# Patient Record
Sex: Male | Born: 1960 | Race: White | Hispanic: No | Marital: Married | Smoking: Never smoker
Health system: Southern US, Community
[De-identification: ages and names within clinical notes are randomized; demographics above are authoritative.]

---

## 2004-03-27 ENCOUNTER — Ambulatory Visit: Payer: Self-pay | Admitting: Internal Medicine

## 2004-05-12 ENCOUNTER — Ambulatory Visit: Payer: Self-pay | Admitting: Internal Medicine

## 2004-06-09 ENCOUNTER — Ambulatory Visit: Payer: Self-pay | Admitting: Internal Medicine

## 2004-06-27 ENCOUNTER — Ambulatory Visit: Payer: Self-pay | Admitting: Internal Medicine

## 2004-09-20 ENCOUNTER — Ambulatory Visit: Payer: Self-pay | Admitting: Internal Medicine

## 2004-09-25 ENCOUNTER — Ambulatory Visit: Payer: Self-pay | Admitting: Internal Medicine

## 2004-10-25 ENCOUNTER — Ambulatory Visit: Payer: Self-pay | Admitting: Internal Medicine

## 2005-01-31 ENCOUNTER — Ambulatory Visit: Payer: Self-pay | Admitting: Internal Medicine

## 2005-02-11 ENCOUNTER — Emergency Department: Payer: Self-pay | Admitting: Emergency Medicine

## 2005-02-25 ENCOUNTER — Ambulatory Visit: Payer: Self-pay | Admitting: Internal Medicine

## 2005-04-22 ENCOUNTER — Ambulatory Visit: Payer: Self-pay | Admitting: Internal Medicine

## 2005-06-03 ENCOUNTER — Ambulatory Visit: Payer: Self-pay | Admitting: Internal Medicine

## 2005-09-09 ENCOUNTER — Ambulatory Visit: Payer: Self-pay | Admitting: Internal Medicine

## 2005-09-25 ENCOUNTER — Ambulatory Visit: Payer: Self-pay | Admitting: Internal Medicine

## 2005-11-30 ENCOUNTER — Ambulatory Visit: Payer: Self-pay | Admitting: Internal Medicine

## 2005-12-25 ENCOUNTER — Ambulatory Visit: Payer: Self-pay | Admitting: Internal Medicine

## 2006-03-15 ENCOUNTER — Ambulatory Visit: Payer: Self-pay | Admitting: Internal Medicine

## 2006-03-27 ENCOUNTER — Ambulatory Visit: Payer: Self-pay | Admitting: Internal Medicine

## 2006-05-15 ENCOUNTER — Ambulatory Visit: Payer: Self-pay | Admitting: Internal Medicine

## 2006-05-16 ENCOUNTER — Ambulatory Visit: Payer: Self-pay | Admitting: Internal Medicine

## 2006-05-27 ENCOUNTER — Ambulatory Visit: Payer: Self-pay | Admitting: Internal Medicine

## 2006-06-11 ENCOUNTER — Emergency Department: Payer: Self-pay | Admitting: Emergency Medicine

## 2006-11-26 ENCOUNTER — Ambulatory Visit: Payer: Self-pay | Admitting: Internal Medicine

## 2006-11-28 ENCOUNTER — Ambulatory Visit: Payer: Self-pay | Admitting: Internal Medicine

## 2006-12-26 ENCOUNTER — Ambulatory Visit: Payer: Self-pay | Admitting: Internal Medicine

## 2007-05-14 IMAGING — CR DG CHEST 2V
1 series · 4 of 4 positions shown · non-contrast
Comparison: none

REASON FOR EXAM: Xray chest seminoma, lung mass
COMMENTS:

[Series 1: view not recorded · 0.17mm/px · 4 of 4 slices shown]
[im 1/4]
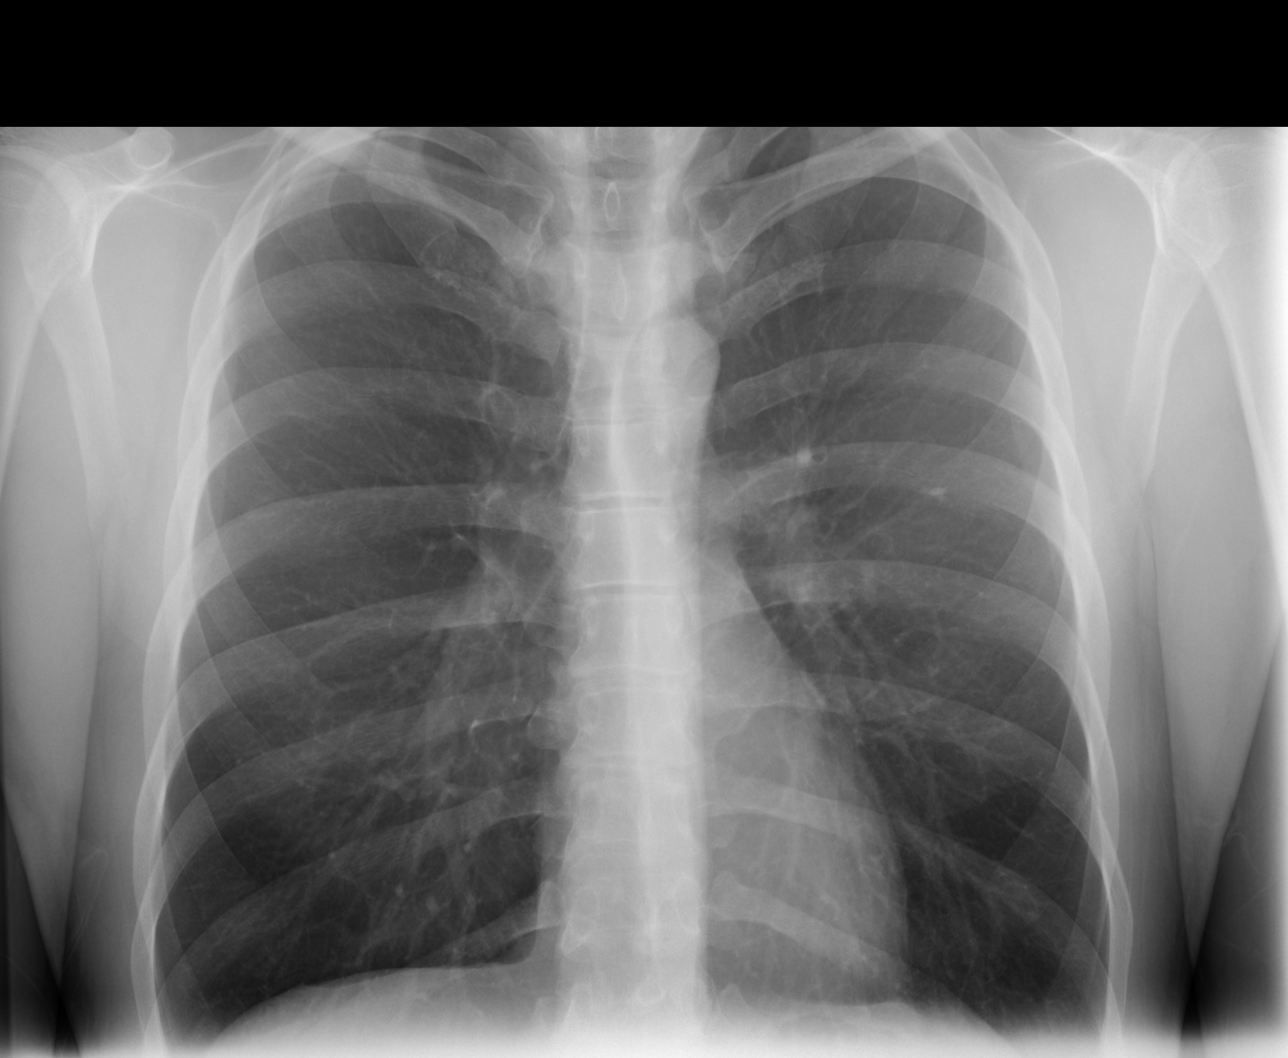
[im 2/4]
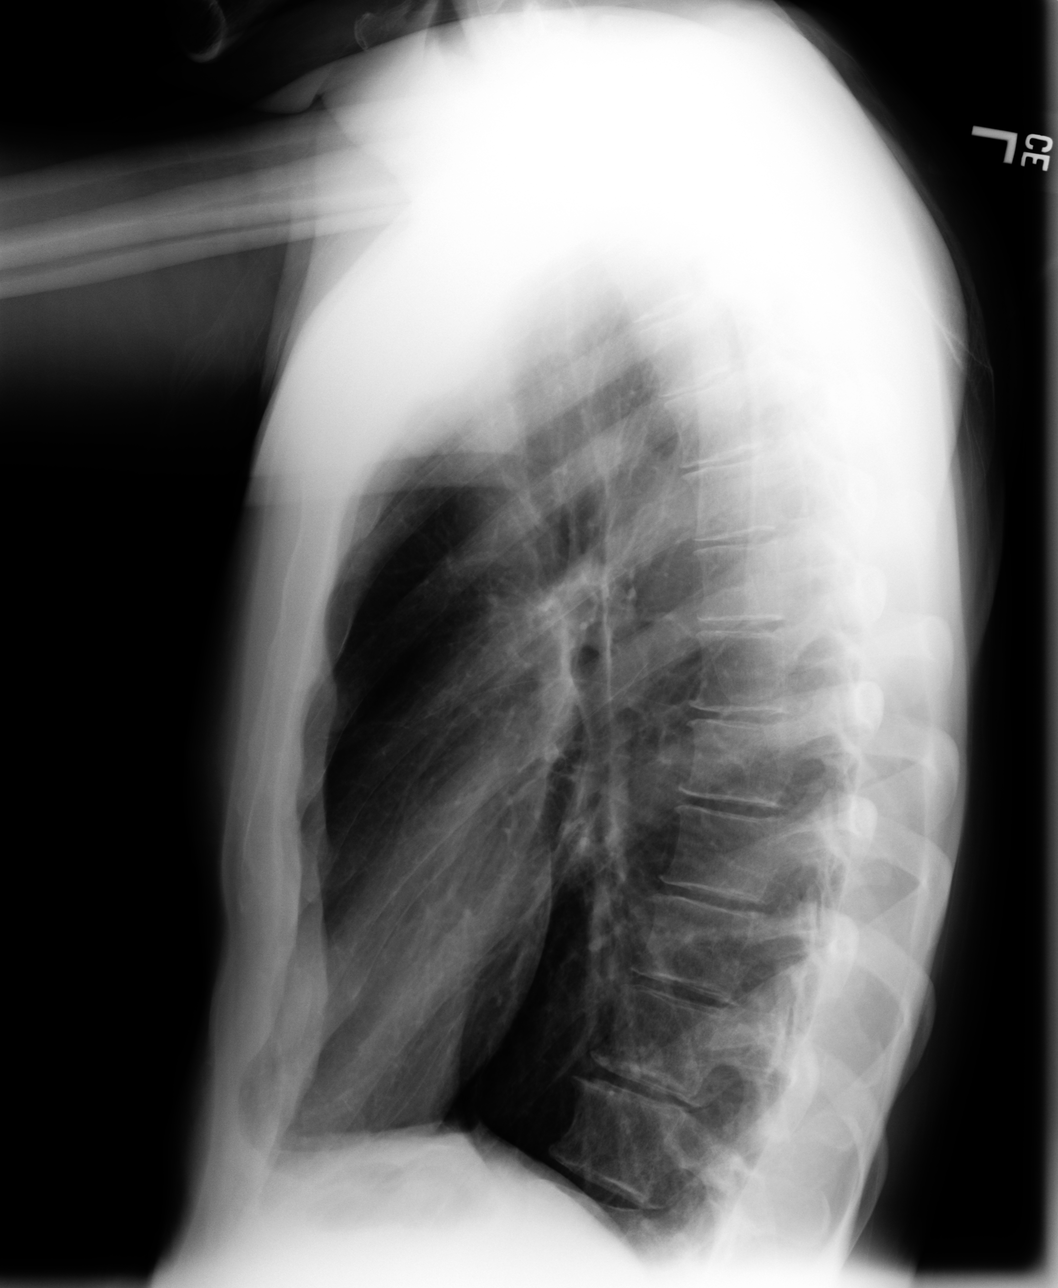
[im 3/4]
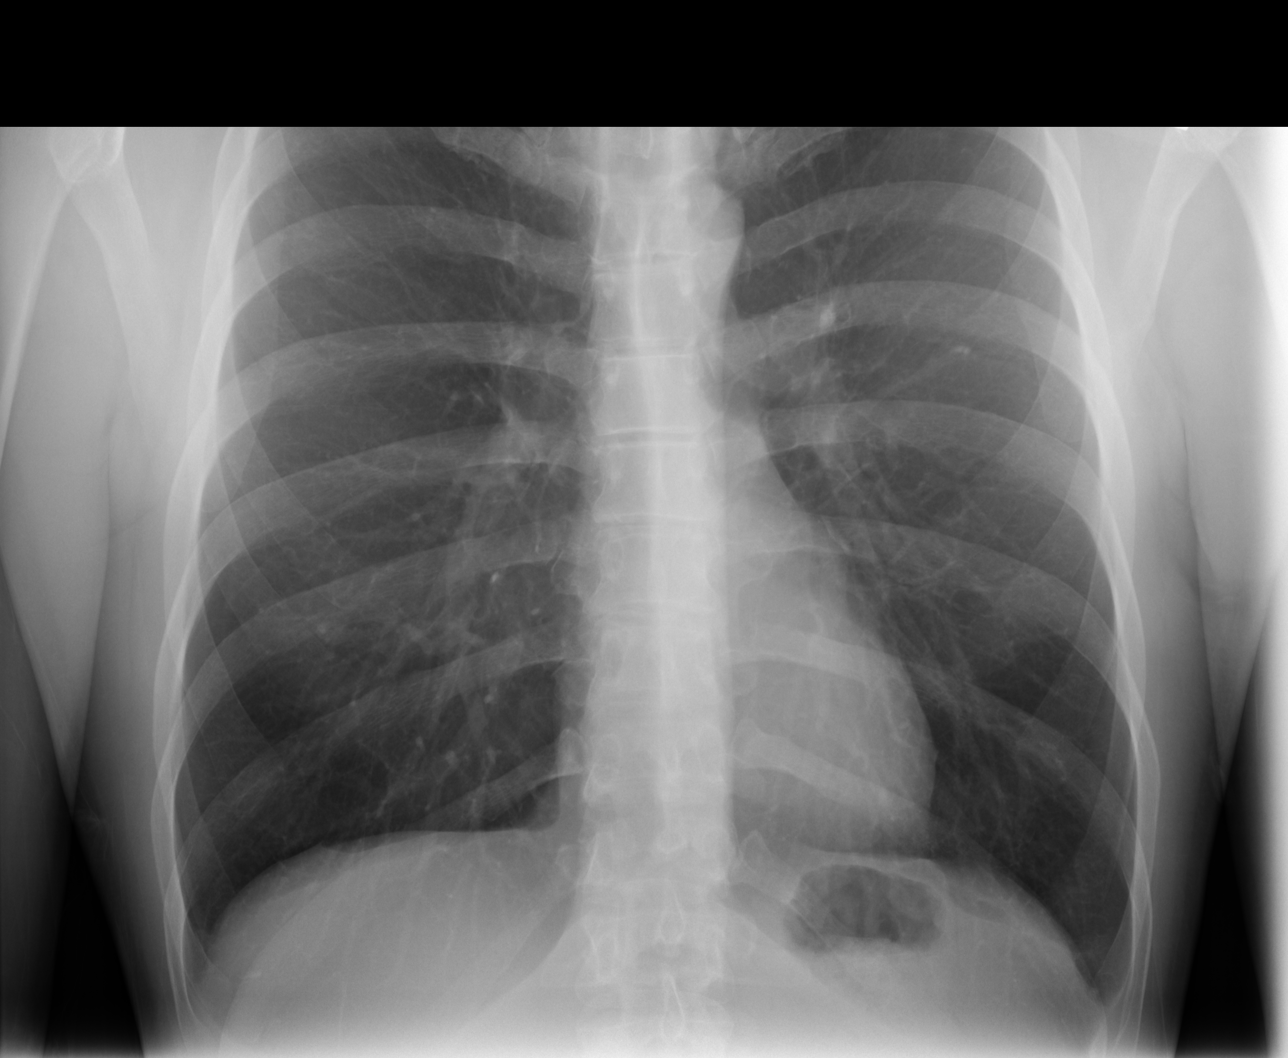
[im 4/4]
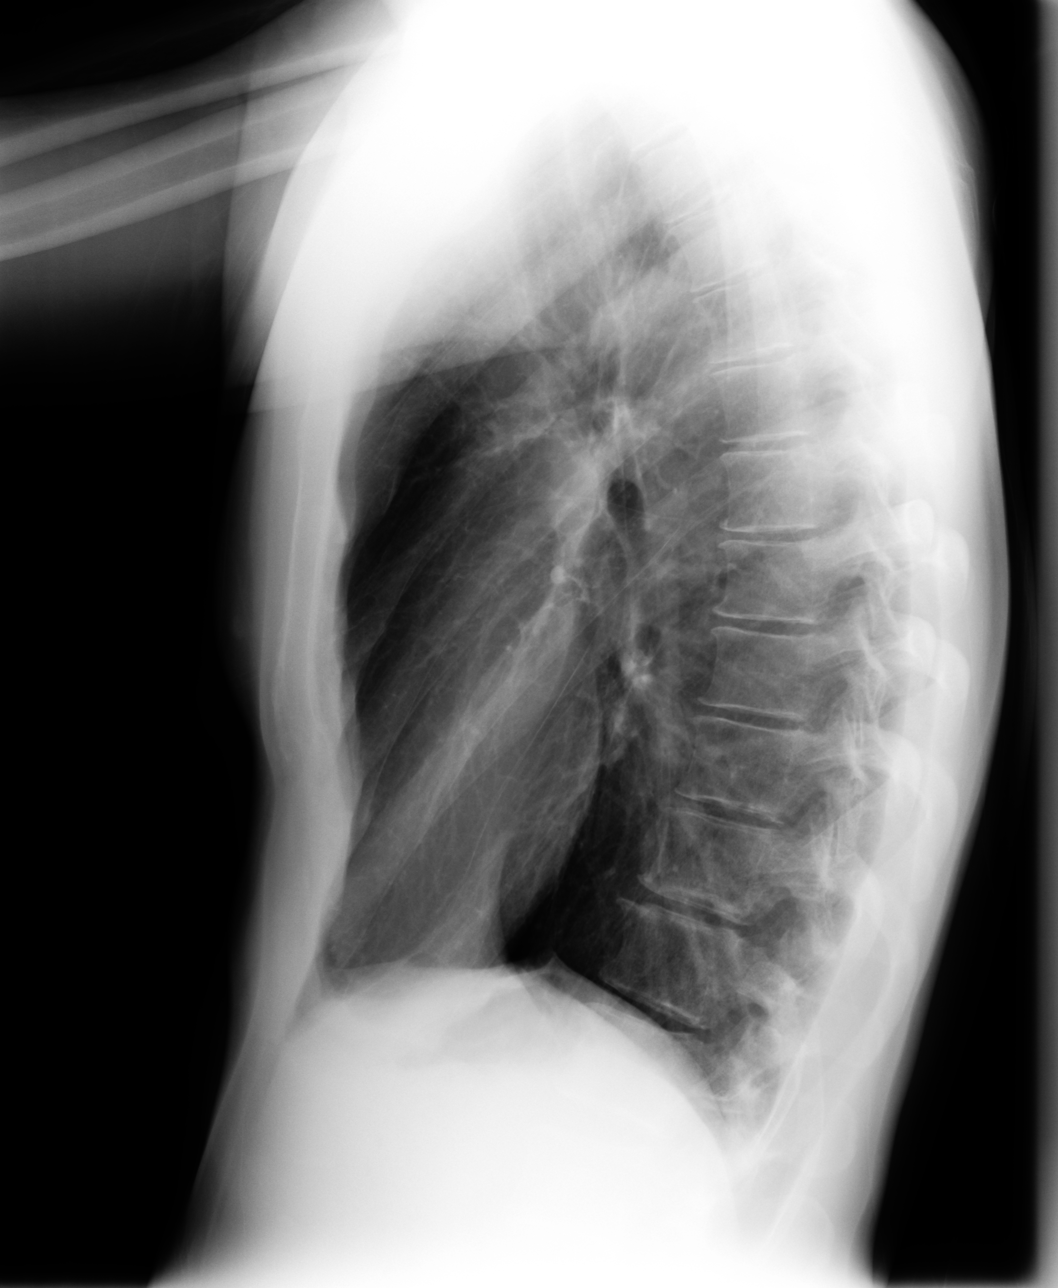

[4 of 4 positions shown; findings below may reference images not displayed]

PROCEDURE:     DXR - DXR CHEST PA (OR AP) AND LATERAL  - December 01, 2005  [DATE]

RESULT:     There is a clinical diagnosis of seminoma and the patient is
being evaluated for evidence of metastatic disease.

The lungs are mildly hyperinflated but clear.  There is no pleural effusion.
 The heart is not enlarged and the pulmonary vascularity is not engorged.
IMPRESSION: I do not see evidence of acute cardiopulmonary disease.  There is no
evidence of metastatic disease.

## 2007-05-28 ENCOUNTER — Ambulatory Visit: Payer: Self-pay | Admitting: Internal Medicine

## 2007-05-29 ENCOUNTER — Ambulatory Visit: Payer: Self-pay | Admitting: Internal Medicine

## 2007-06-28 ENCOUNTER — Ambulatory Visit: Payer: Self-pay | Admitting: Internal Medicine

## 2007-12-10 ENCOUNTER — Ambulatory Visit: Payer: Self-pay | Admitting: Internal Medicine

## 2007-12-26 ENCOUNTER — Ambulatory Visit: Payer: Self-pay | Admitting: Internal Medicine

## 2008-05-27 ENCOUNTER — Ambulatory Visit: Payer: Self-pay | Admitting: Internal Medicine

## 2008-06-09 ENCOUNTER — Ambulatory Visit: Payer: Self-pay | Admitting: Internal Medicine

## 2008-06-27 ENCOUNTER — Ambulatory Visit: Payer: Self-pay | Admitting: Internal Medicine

## 2008-12-08 IMAGING — CT CT ABD-PELV W/ CM
1 of 3 series · 14 of 32 positions shown, 19 images · non-contrast
Comparison: none

REASON FOR EXAM: recurrent seminoma eval lymphoid lung nodules
COMMENTS:

[Series 2: abdomen · axial · 0.73mm/px · z∈[-626,-226]mm · 14 of 56 slices shown, 19 images]
[im 3/56  soft-tissue]
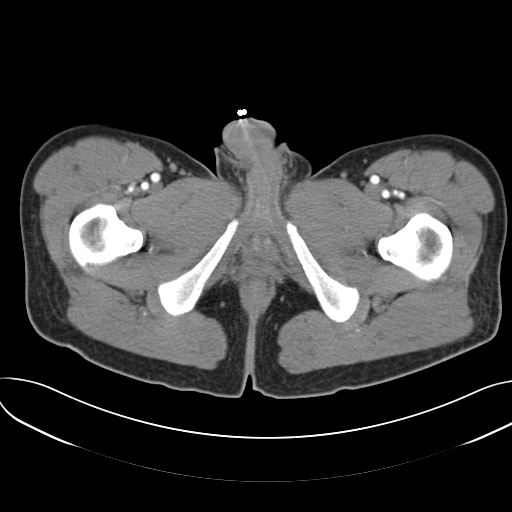
[im 3/56  bone]
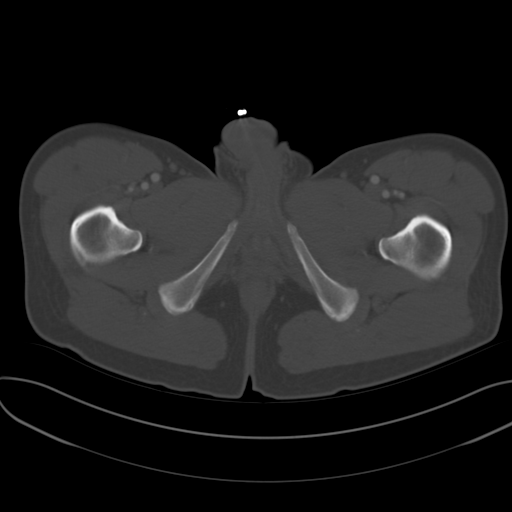
[im 9/56  soft-tissue]
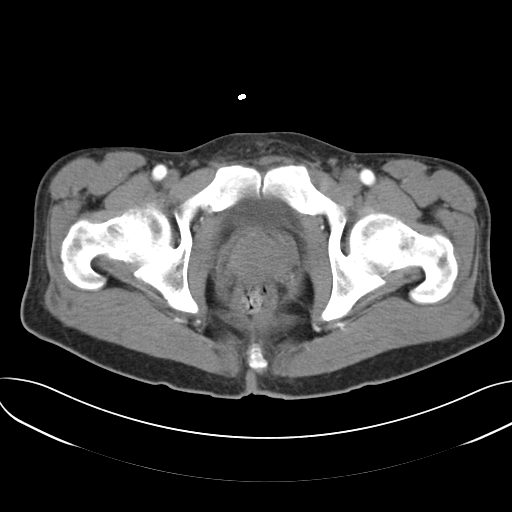
[im 12/56  soft-tissue]
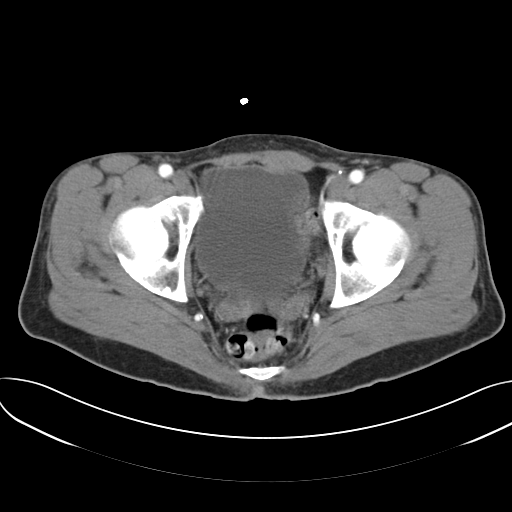
[im 17/56  soft-tissue]
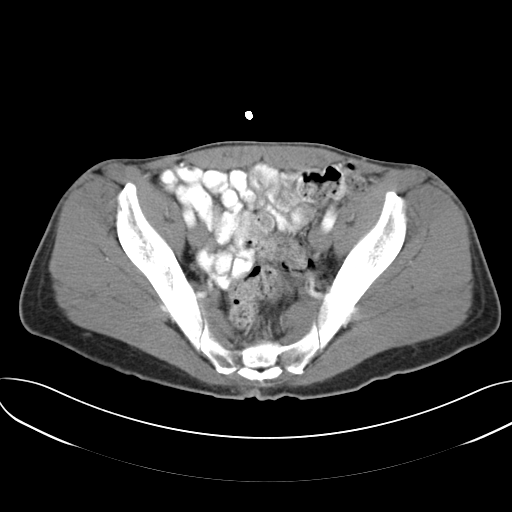
[im 20/56  soft-tissue]
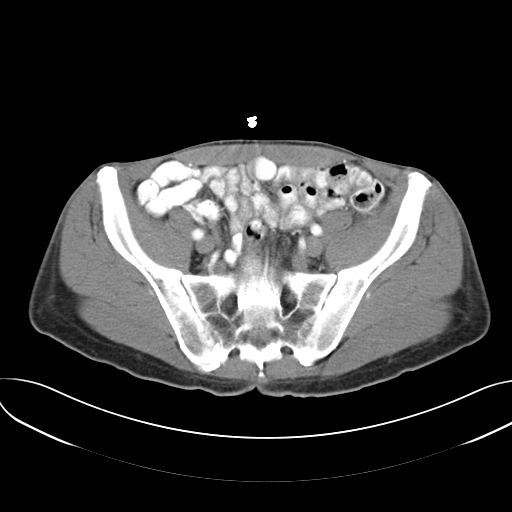
[im 25/56  soft-tissue]
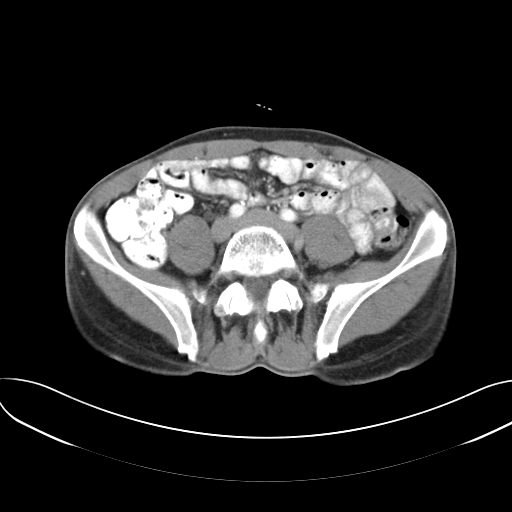
[im 28/56  soft-tissue]
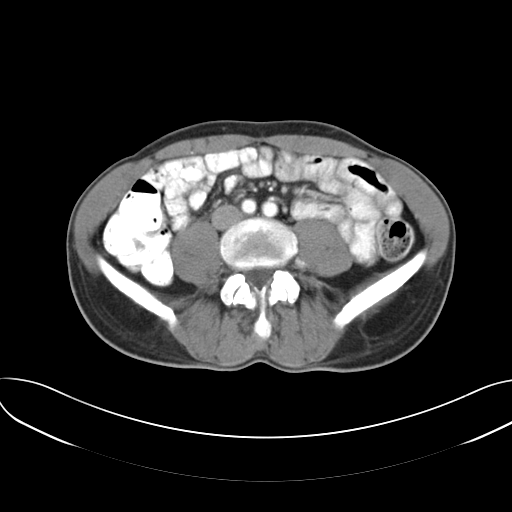
[im 31/56  soft-tissue]
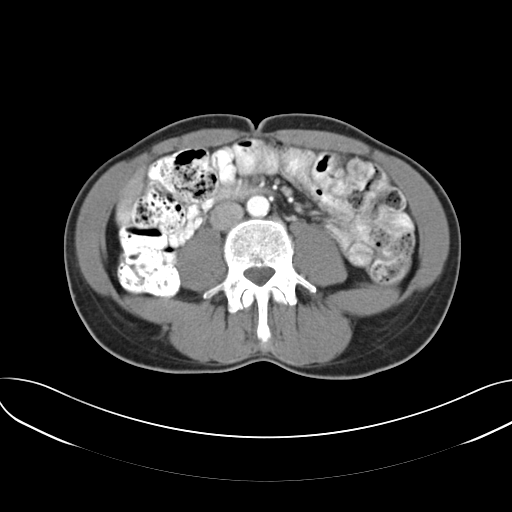
[im 36/56  soft-tissue]
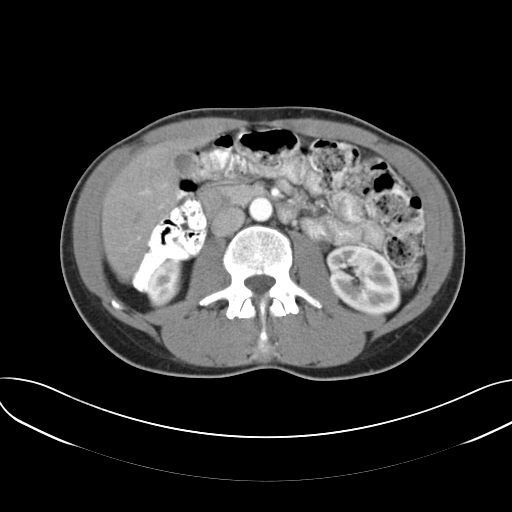
[im 36/56  bone]
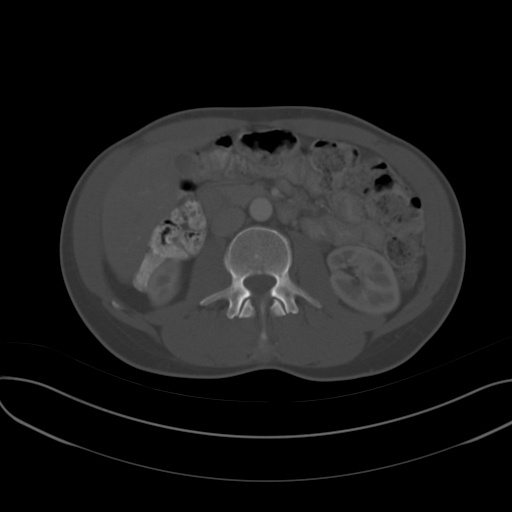
[im 39/56  soft-tissue]
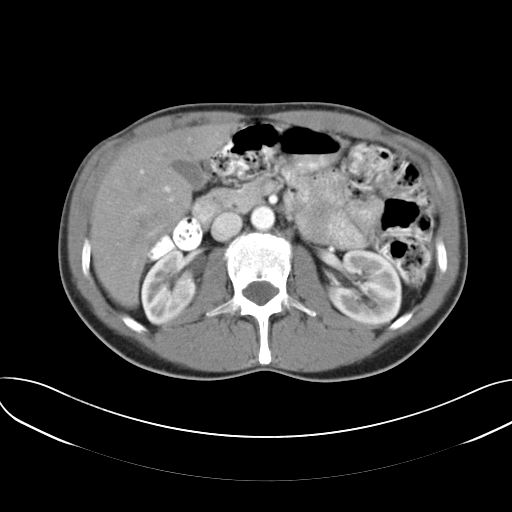
[im 45/56  soft-tissue]
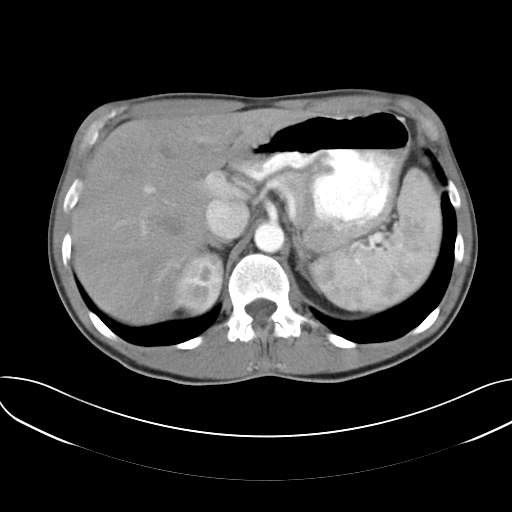
[im 45/56  lung]
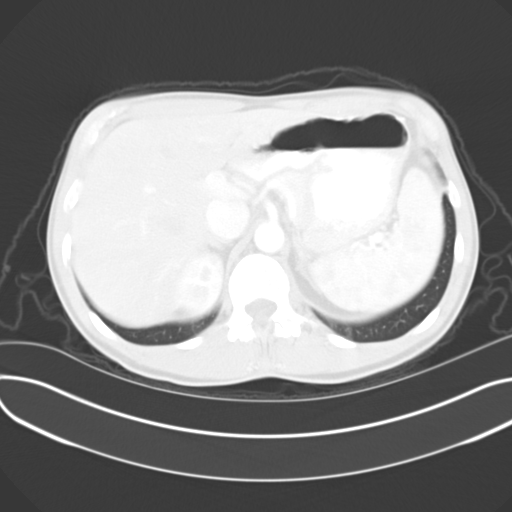
[im 47/56  soft-tissue]
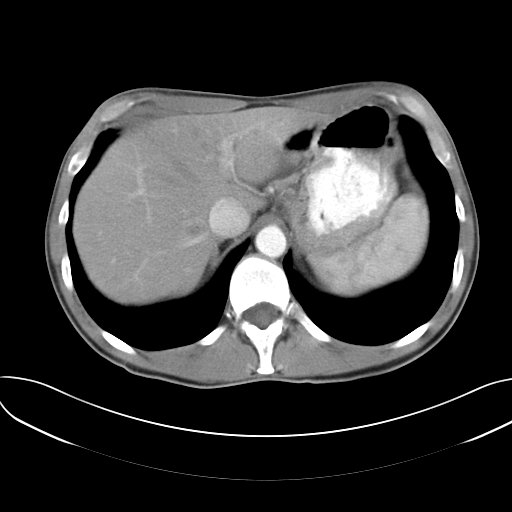
[im 47/56  lung]
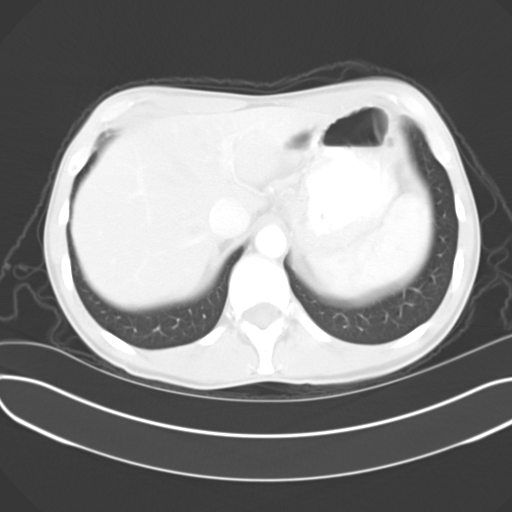
[im 50/56  lung]
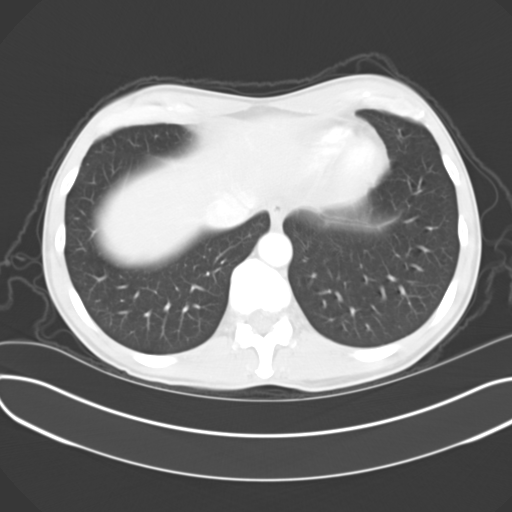
[im 53/56  soft-tissue]
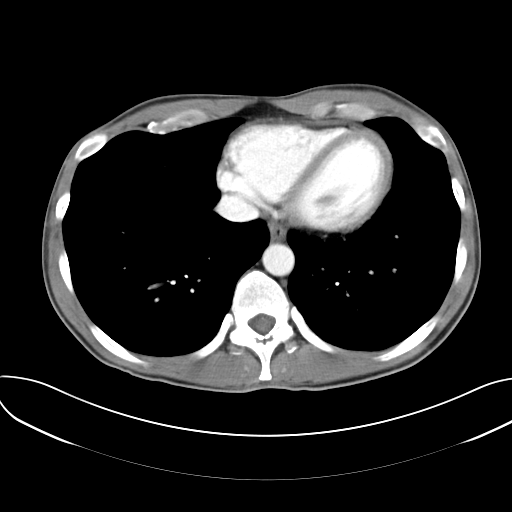
[im 53/56  lung]
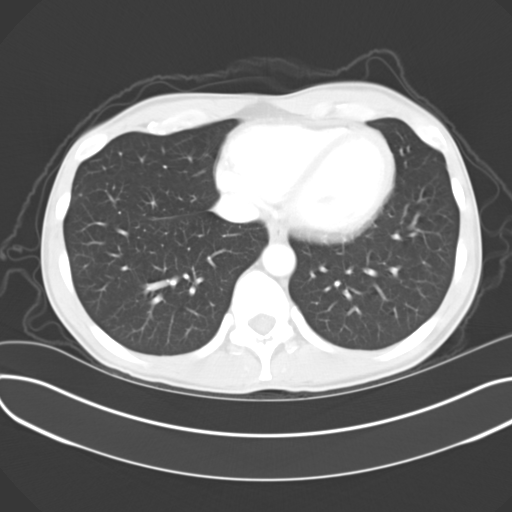

[14 of 32 positions shown; findings below may reference images not displayed]

PROCEDURE:     CT  - CT ABDOMEN / PELVIS  W  - May 29, 2007 [DATE]

RESULT:     Helical 8 mm sections were obtained from the lung bases through
the pubic symphysis status post intravenous administration of 85 ml of
[id number] and oral contrast. This study was compared to a previous study
dated 05/15/06.

Evaluation of the lung bases demonstrates no gross abnormalities. The liver,
spleen, adrenals, pancreas and kidneys are unremarkable. There is no
evidence of abdominal or pelvic free fluid, drainable loculated fluid
collections, masses or adenopathy.   A prostatic testicle is identified
within the LEFT scrotal region.  The urinary bladder is distended with
urine. There is no CT evidence of bowel obstruction, diverticulitis, colitis
or enteritis.  No CT evidence of an abdominal aortic aneurysm. The celiac,
SMA, portal vein and SMV are opacified.
IMPRESSION: No CT evidence reflecting the sequela of recurrent disease. If clinically
appropriate, further evaluation with PET imaging is recommended.

## 2009-05-27 ENCOUNTER — Ambulatory Visit: Payer: Self-pay | Admitting: Internal Medicine

## 2009-06-03 ENCOUNTER — Ambulatory Visit: Payer: Self-pay | Admitting: Internal Medicine

## 2009-06-27 ENCOUNTER — Ambulatory Visit: Payer: Self-pay | Admitting: Internal Medicine

## 2013-02-08 ENCOUNTER — Ambulatory Visit: Payer: Self-pay | Admitting: Unknown Physician Specialty

## 2013-02-11 LAB — PATHOLOGY REPORT

## 2022-07-18 ENCOUNTER — Other Ambulatory Visit: Payer: Self-pay | Admitting: Neurology

## 2022-07-18 DIAGNOSIS — R29818 Other symptoms and signs involving the nervous system: Secondary | ICD-10-CM

## 2022-07-29 ENCOUNTER — Ambulatory Visit: Payer: Self-pay

## 2022-08-01 ENCOUNTER — Ambulatory Visit
Admission: RE | Admit: 2022-08-01 | Discharge: 2022-08-01 | Disposition: A | Discharge status: Home / Self Care | Payer: BC Managed Care – PPO | Source: Ambulatory Visit | Attending: Neurology | Admitting: Neurology

## 2022-08-01 DIAGNOSIS — E348 Other specified endocrine disorders: Secondary | ICD-10-CM | POA: Diagnosis not present

## 2022-08-01 DIAGNOSIS — R29818 Other symptoms and signs involving the nervous system: Secondary | ICD-10-CM | POA: Insufficient documentation

## 2022-08-01 DIAGNOSIS — R42 Dizziness and giddiness: Secondary | ICD-10-CM | POA: Diagnosis not present

## 2022-08-01 DIAGNOSIS — G20A1 Parkinson's disease without dyskinesia, without mention of fluctuations: Secondary | ICD-10-CM | POA: Insufficient documentation

## 2022-08-01 MED ORDER — GADOBUTROL 1 MMOL/ML IV SOLN
7.5000 mL | Freq: Once | INTRAVENOUS | Status: AC | PRN
  Administered 2022-08-01: 7.5 mL via INTRAVENOUS

## 2022-08-17 ENCOUNTER — Encounter: Payer: Self-pay | Admitting: Urology

## 2022-08-17 ENCOUNTER — Ambulatory Visit: Payer: BC Managed Care – PPO | Admitting: Urology

## 2022-08-17 VITALS — BP 115/73 | HR 67 | Ht 72.0 in | Wt 155.0 lb

## 2022-08-17 DIAGNOSIS — E291 Testicular hypofunction: Secondary | ICD-10-CM | POA: Diagnosis not present

## 2022-08-17 DIAGNOSIS — N529 Male erectile dysfunction, unspecified: Secondary | ICD-10-CM | POA: Diagnosis not present

## 2022-08-17 NOTE — Progress Notes (Signed)
   08/17/2022 11:30 AM   Kyle Hilding Sr. 29-Jun-1960 [id number]  Referring provider: No referring provider defined for this encounter.  Chief Complaint  Patient presents with   Hypogonadism    HPI: Kyle Parks. is a 62 y.o. male who desires to transfer urologic care after the recent retirement of Dr. Yves Parks.  Follow-up for hypogonadism on Fortesta 40 mg daily Previous history of bilateral testicular cancer Last visit with Dr. Yves Parks December 2023: Labs H/H 15.1/44.5; testosterone 280; PSA 0.8 Over the past few months he has noted some difficulty achieving and maintaining an erection and is interested in a PDE 5 inhibitor trial.  No history of cardiac disease, nitrate therapy.   PMH: Parkinson's disease  Surgical History: Bilateral orchiectomy  Home Medications:  Allergies as of 08/17/2022   No Known Allergies      Medication List        Accurate as of August 17, 2022 11:30 AM. If you have any questions, ask your nurse or doctor.          carbidopa-levodopa 25-100 MG tablet Commonly known as: SINEMET IR Take 1 tablet by mouth 3 (three) times daily.   citalopram 20 MG tablet Commonly known as: CELEXA   mometasone 50 MCG/ACT nasal spray Commonly known as: NASONEX   Testosterone 10 MG/ACT (2%) Gel        Allergies: No Known Allergies  Family History: History reviewed. No pertinent family history.  Social History:  reports that he has never smoked. He has never used smokeless tobacco. No history on file for alcohol use and drug use.   Physical Exam: BP 115/73   Pulse 67   Ht 6' (1.829 m)   Wt 155 lb (70.3 kg)   BMI 21.02 kg/m   Constitutional:  Alert and oriented, No acute distress. HEENT: [place] AT Respiratory: Normal respiratory effort, no increased work of  Psychiatric: Normal mood and affect.   Assessment & Plan:    1.  Hypogonadism Kyle Parks refilled though it may no longer be manufactured and if unable to obtain will switch  to testosterone gel 1.62% Lab visit for testosterone level/hematocrit June 2024 and office visit December 2024  2.  Erectile dysfunction Interested in trying a PDE 5 inhibitor Rx tadalafil 20 mg sent to pharmacy.  Potential side effects discussed   Kyle Sons, MD  [facility] [address] [place], [place] [address] [phone]

## 2022-08-18 ENCOUNTER — Encounter: Payer: Self-pay | Admitting: Urology

## 2022-08-18 ENCOUNTER — Other Ambulatory Visit: Payer: Self-pay | Admitting: Urology

## 2022-08-18 DIAGNOSIS — E291 Testicular hypofunction: Secondary | ICD-10-CM

## 2022-08-18 MED ORDER — TADALAFIL 20 MG PO TABS: ORAL_TABLET | ORAL | 0 refills | Status: AC

## 2022-08-18 MED ORDER — TESTOSTERONE 10 MG/ACT (2%) TD GEL: TRANSDERMAL | 3 refills | Status: DC

## 2022-08-19 MED ORDER — TESTOSTERONE 10 MG/ACT (2%) TD GEL: TRANSDERMAL | 3 refills | Status: DC

## 2022-08-19 NOTE — Addendum Note (Signed)
Addended by: Mickle Plumb on: 08/19/2022 01:20 PM   Modules accepted: Orders

## 2022-08-19 NOTE — Addendum Note (Signed)
Addended by: Kyra Manges on: 08/19/2022 01:18 PM   Modules accepted: Orders

## 2022-09-01 ENCOUNTER — Telehealth: Payer: Self-pay

## 2022-09-01 NOTE — Telephone Encounter (Signed)
Called received via triage line- 839am  Pt states he needs a rx for Cialis called into [facility] on [address]  Saw [facility] last week.  Rx sent to [facility] by [facility] on 2/22 at  728am.   Per [facility] they have the rx.   Med not covered by his insurance. Per Richrd Sox at [facility] used good rx and rx was only 12.99 for 10 tabs. She will process rx and send pt text when ready for p/u.   Pt aware.

## 2022-11-09 ENCOUNTER — Ambulatory Visit: Payer: Self-pay | Admitting: Urology

## 2022-12-09 ENCOUNTER — Other Ambulatory Visit: Payer: Self-pay | Admitting: Family Medicine

## 2022-12-09 DIAGNOSIS — E291 Testicular hypofunction: Secondary | ICD-10-CM

## 2022-12-14 ENCOUNTER — Other Ambulatory Visit: Payer: BC Managed Care – PPO

## 2022-12-14 DIAGNOSIS — E291 Testicular hypofunction: Secondary | ICD-10-CM

## 2022-12-15 ENCOUNTER — Encounter: Payer: Self-pay | Admitting: Urology

## 2022-12-15 LAB — HEMATOCRIT: Hematocrit: 44.1 % (ref 37.5–51.0)

## 2022-12-15 LAB — TESTOSTERONE: Testosterone: 275 ng/dL (ref 264–916)

## 2023-01-06 ENCOUNTER — Other Ambulatory Visit: Payer: Self-pay | Admitting: Physician Assistant

## 2023-01-06 DIAGNOSIS — E291 Testicular hypofunction: Secondary | ICD-10-CM

## 2023-01-08 ENCOUNTER — Telehealth: Payer: Self-pay | Admitting: Urology

## 2023-01-08 DIAGNOSIS — E291 Testicular hypofunction: Secondary | ICD-10-CM

## 2023-01-08 NOTE — Telephone Encounter (Signed)
Testosterone level was low on 12/15/2022 and it does not look like he ever read his MyChart message.  Please verify how much gel he was applying and if he was applying regularly when the blood was drawn on 6/20.

## 2023-01-09 NOTE — Telephone Encounter (Signed)
Patient states he appled the gel before he came to appt . He states he uses 3.5 pumps daily. He use it on a regular basis.

## 2023-01-10 NOTE — Telephone Encounter (Signed)
 Notified patient as instructed, patient pleased. Discussed follow-up appointments, patient agrees  

## 2023-01-10 NOTE — Telephone Encounter (Signed)
Have him increase to 4 pumps daily.  Repeat testosterone level 6 weeks

## 2023-01-10 NOTE — Addendum Note (Signed)
Addended by: Levada Schilling on: 01/10/2023 04:47 PM   Modules accepted: Orders

## 2023-02-23 ENCOUNTER — Other Ambulatory Visit: Payer: BC Managed Care – PPO

## 2023-03-02 ENCOUNTER — Telehealth: Payer: Self-pay | Admitting: *Deleted

## 2023-03-02 NOTE — Telephone Encounter (Signed)
Patient states that [facility] has stop making  this gel.  He would like to try a different gel sent to [facility].

## 2023-03-03 ENCOUNTER — Encounter: Payer: Self-pay | Admitting: *Deleted

## 2023-03-03 MED ORDER — TESTOSTERONE 20.25 MG/ACT (1.62%) TD GEL: TRANSDERMAL | 2 refills | Status: DC

## 2023-03-03 NOTE — Telephone Encounter (Signed)
Advised patient medication gel sent to [facility] by My Chart.

## 2023-03-03 NOTE — Addendum Note (Signed)
Addended by: Riki Altes on: 03/03/2023 02:53 PM   Modules accepted: Orders

## 2023-03-03 NOTE — Telephone Encounter (Signed)
Rx generic AndroGel was sent to [facility].  Schedule lab visit for testosterone level in 6 weeks

## 2023-03-03 NOTE — Telephone Encounter (Signed)
[facility] dont have the gel, He would like to try something else.

## 2023-03-15 ENCOUNTER — Other Ambulatory Visit: Payer: BC Managed Care – PPO

## 2023-03-15 DIAGNOSIS — E291 Testicular hypofunction: Secondary | ICD-10-CM

## 2023-03-16 LAB — TESTOSTERONE: Testosterone: 223 ng/dL — ABNORMAL LOW (ref 264–916)

## 2023-05-30 ENCOUNTER — Other Ambulatory Visit: Payer: Self-pay | Admitting: Urology

## 2023-06-06 ENCOUNTER — Other Ambulatory Visit: Payer: BC Managed Care – PPO

## 2023-06-06 ENCOUNTER — Other Ambulatory Visit: Payer: Self-pay | Admitting: *Deleted

## 2023-06-06 DIAGNOSIS — E291 Testicular hypofunction: Secondary | ICD-10-CM

## 2023-06-06 DIAGNOSIS — R972 Elevated prostate specific antigen [PSA]: Secondary | ICD-10-CM

## 2023-06-07 ENCOUNTER — Other Ambulatory Visit: Payer: BC Managed Care – PPO

## 2023-06-07 DIAGNOSIS — R972 Elevated prostate specific antigen [PSA]: Secondary | ICD-10-CM

## 2023-06-07 DIAGNOSIS — E291 Testicular hypofunction: Secondary | ICD-10-CM

## 2023-06-08 LAB — HEMOGLOBIN AND HEMATOCRIT, BLOOD
Hematocrit: 44.8 % (ref 37.5–51.0)
Hemoglobin: 14.5 g/dL (ref 13.0–17.7)

## 2023-06-08 LAB — PSA: Prostate Specific Ag, Serum: 0.5 ng/mL (ref 0.0–4.0)

## 2023-06-08 LAB — TESTOSTERONE: Testosterone: 456 ng/dL (ref 264–916)

## 2023-06-16 ENCOUNTER — Ambulatory Visit: Payer: BC Managed Care – PPO | Admitting: Urology

## 2023-06-16 VITALS — BP 102/69 | HR 62 | Ht 72.0 in | Wt 160.0 lb

## 2023-06-16 DIAGNOSIS — N529 Male erectile dysfunction, unspecified: Secondary | ICD-10-CM

## 2023-06-16 DIAGNOSIS — E291 Testicular hypofunction: Secondary | ICD-10-CM

## 2023-06-16 NOTE — Progress Notes (Signed)
   Kyle Parks,acting as a scribe for Riki Altes, MD.,have documented all relevant documentation on the behalf of Riki Altes, MD,as directed by  Riki Altes, MD while in the presence of Riki Altes, MD.   06/16/23 12:21 PM   Kyle Maduro Sr. 08-30-1960 [id number]  Referring provider: No referring provider defined for this encounter.  Chief Complaint  Patient presents with   Hypogonadism   Urologic history: 1. Hypogonadism History of bilateral testicular cancer Previously followed by Dr. Evelene Croon on Azucena Freed Converted to generic androgel after Azucena Freed no longer manufactured  2. Erectile dysfunction Tadalafil 20 mg, PRN  HPI: Kyle Parks. is a 62 y.o. male who presents for a follow up visit.   No problems since his last visit No bothersome LUTS Lab 06/07/2023: Testosterone 456; H&H 14.5-44.8; PSA 0.5   Home Medications:  Allergies as of 06/16/2023   No Known Allergies      Medication List        Accurate as of June 16, 2023 12:21 PM. If you have any questions, ask your nurse or doctor.          STOP taking these medications    carbidopa-levodopa 25-100 MG tablet Commonly known as: SINEMET IR       TAKE these medications    citalopram 20 MG tablet Commonly known as: CELEXA   mometasone 50 MCG/ACT nasal spray Commonly known as: NASONEX   Paxlovid (300/100) 20 x 150 MG & 10 x 100MG  Tbpk Generic drug: nirmatrelvir/ritonavir Take by mouth.   tadalafil 20 MG tablet Commonly known as: CIALIS 1 tab as needed 1 hour prior to intercourse   Testosterone 1.62 % Gel APPLY 1 PUMP TO EACH SHOULDER DAILY         Social History:  reports that he has never smoked. He has never used smokeless tobacco. No history on file for alcohol use and drug use.   Physical Exam: BP 102/69   Pulse 62   Ht 6' (1.829 m)   Wt 160 lb (72.6 kg)   BMI 21.70 kg/m   Constitutional:  Alert and oriented, No acute distress. HEENT: [place]  AT, moist mucus membranes.  Trachea midline, no masses. Neurologic: Grossly intact, no focal deficits, moving all 4 extremities. Psychiatric: Normal mood and affect.   Assessment & Plan:    1. Hypogonadism Stable on generic androgel Lab visit 6 months with testosterone level, H&H 1 year office visit with testosterone, H&H. His PCP checks his PSA annually   2. Erectile dysfunction Stable on tadalafil   [facility] [address], [address] [place], [place] [address] [phone]

## 2023-06-21 ENCOUNTER — Encounter: Payer: Self-pay | Admitting: Urology

## 2023-10-19 ENCOUNTER — Other Ambulatory Visit: Payer: Self-pay | Admitting: Urology

## 2023-10-22 ENCOUNTER — Encounter: Payer: Self-pay | Admitting: Urology

## 2023-10-23 ENCOUNTER — Other Ambulatory Visit: Payer: Self-pay | Admitting: *Deleted

## 2023-11-15 ENCOUNTER — Ambulatory Visit: Payer: Self-pay

## 2023-11-15 DIAGNOSIS — K640 First degree hemorrhoids: Secondary | ICD-10-CM | POA: Diagnosis not present

## 2023-11-15 DIAGNOSIS — D124 Benign neoplasm of descending colon: Secondary | ICD-10-CM | POA: Diagnosis not present

## 2023-11-15 DIAGNOSIS — D125 Benign neoplasm of sigmoid colon: Secondary | ICD-10-CM | POA: Diagnosis not present

## 2023-11-15 DIAGNOSIS — D122 Benign neoplasm of ascending colon: Secondary | ICD-10-CM | POA: Diagnosis not present

## 2023-11-15 DIAGNOSIS — Z1211 Encounter for screening for malignant neoplasm of colon: Secondary | ICD-10-CM | POA: Diagnosis present

## 2023-11-15 DIAGNOSIS — K5730 Diverticulosis of large intestine without perforation or abscess without bleeding: Secondary | ICD-10-CM | POA: Diagnosis not present

## 2023-11-15 DIAGNOSIS — D123 Benign neoplasm of transverse colon: Secondary | ICD-10-CM | POA: Diagnosis not present

## 2023-12-15 ENCOUNTER — Other Ambulatory Visit: Payer: Self-pay

## 2023-12-15 DIAGNOSIS — E291 Testicular hypofunction: Secondary | ICD-10-CM

## 2023-12-16 LAB — TESTOSTERONE: Testosterone: 517 ng/dL (ref 264–916)

## 2023-12-16 LAB — HEMOGLOBIN AND HEMATOCRIT, BLOOD
Hematocrit: 44.2 % (ref 37.5–51.0)
Hemoglobin: 14.6 g/dL (ref 13.0–17.7)

## 2023-12-17 ENCOUNTER — Ambulatory Visit: Payer: Self-pay | Admitting: Urology

## 2024-02-03 ENCOUNTER — Other Ambulatory Visit: Payer: Self-pay | Admitting: Urology

## 2024-06-11 ENCOUNTER — Other Ambulatory Visit

## 2024-06-11 DIAGNOSIS — E291 Testicular hypofunction: Secondary | ICD-10-CM

## 2024-06-12 LAB — HEMOGLOBIN AND HEMATOCRIT, BLOOD
Hematocrit: 43.9 % (ref 37.5–51.0)
Hemoglobin: 14.7 g/dL (ref 13.0–17.7)

## 2024-06-12 LAB — TESTOSTERONE: Testosterone: 694 ng/dL (ref 264–916)

## 2024-06-14 ENCOUNTER — Other Ambulatory Visit: Payer: Self-pay

## 2024-06-14 ENCOUNTER — Ambulatory Visit: Admitting: Urology

## 2024-06-17 ENCOUNTER — Encounter: Payer: Self-pay | Admitting: Urology

## 2024-06-17 ENCOUNTER — Ambulatory Visit: Payer: Self-pay | Admitting: Urology

## 2024-06-24 ENCOUNTER — Ambulatory Visit: Payer: BC Managed Care – PPO | Admitting: Urology

## 2024-06-28 ENCOUNTER — Other Ambulatory Visit: Payer: Self-pay | Admitting: Urology

## 2024-07-09 ENCOUNTER — Ambulatory Visit: Admitting: Urology

## 2024-07-09 VITALS — BP 110/69 | HR 55 | Ht 70.0 in | Wt 178.0 lb

## 2024-07-09 DIAGNOSIS — E291 Testicular hypofunction: Secondary | ICD-10-CM | POA: Diagnosis not present

## 2024-07-09 DIAGNOSIS — Z125 Encounter for screening for malignant neoplasm of prostate: Secondary | ICD-10-CM

## 2024-07-09 NOTE — Progress Notes (Unsigned)
" ° °  07/09/2024 8:57 AM   Kyle GORMAN Ned Sr. 03/26/1961 [id number]  Referring provider: No referring provider defined for this encounter.  Chief Complaint  Patient presents with   Hypogonadism   Urologic history: 1. Hypogonadism History of bilateral testicular cancer Previously followed by Dr. Kassie on Fortesta  Switched to generic androgel  after Fortesta  no longer manufactured   2. Erectile dysfunction Tadalafil  20 mg, PRN   HPI: Kyle Faul. is a 64 y.o. male who presents for annual follow-up.  No complaints since last year's visit Labs 06/11/2024: Testosterone  694 ng/dL, H/H 85.2/56.0 Rarely uses tadalafil    PMH: History of testicular cancer Sleep apnea Parkinson's disease Hypogonadism  Surgical History: Bilateral orchiectomy  Home Medications:  Allergies as of 07/09/2024       Reactions   Citalopram Hydrobromide Other (See Comments)   Sleepy groggy   Clarithromycin Other (See Comments)   Azithromycin is ok   Escitalopram Oxalate Other (See Comments)   Sleepy/Groggy   Penicillin G Dermatitis   Childhood rash   Sertraline Other (See Comments)   Kept him up at night        Medication List        Accurate as of July 09, 2024  8:57 AM. If you have any questions, ask your nurse or doctor.          STOP taking these medications    Paxlovid (300/100) 20 x 150 MG & 10 x 100MG  Tbpk Generic drug: nirmatrelvir/ritonavir       TAKE these medications    rOPINIRole 2 MG 24 hr tablet Commonly known as: REQUIP XL Take 6 mg by mouth at bedtime. The timing of this medication is very important.   citalopram 20 MG tablet Commonly known as: CELEXA   mometasone 50 MCG/ACT nasal spray Commonly known as: NASONEX   tadalafil  20 MG tablet Commonly known as: CIALIS  1 tab as needed 1 hour prior to intercourse   Testosterone  1.62 % Gel APPLY 1 PUMP TO EACH SHOULDER DAILY        Allergies: Allergies[1]  Family History: No family  history on file.  Social History:  reports that he has never smoked. He has never used smokeless tobacco. No history on file for alcohol use and drug use.   Physical Exam: BP 110/69   Pulse (!) 55   Ht 5' 10 (1.778 m)   Wt 178 lb (80.7 kg)   BMI 25.54 kg/m   Constitutional:  Alert, No acute distress. HEENT: [place] AT Respiratory: Normal respiratory effort, no increased work of breathing. Psychiatric: Normal mood and affect.   Assessment & Plan:    1.  Hypogonadism Doing well on TRT Lab visit 6 months testosterone , H/H Office visit 1 year testosterone , H/H, PSA  2.  Prostate cancer screening PSA was not checked at his recent blood work or with PCP in the last year; PSA was drawn today    Kyle JAYSON Barba, MD  [facility] [address] [place], [place] [address] [phone]     [1]  Allergies Allergen Reactions   Citalopram Hydrobromide Other (See Comments)    Sleepy groggy   Clarithromycin Other (See Comments)    Azithromycin is ok   Escitalopram Oxalate Other (See Comments)    Sleepy/Groggy   Penicillin G Dermatitis    Childhood rash   Sertraline Other (See Comments)    Kept him up at night   "

## 2024-07-10 ENCOUNTER — Encounter: Payer: Self-pay | Admitting: Urology

## 2024-07-10 LAB — PSA: Prostate Specific Ag, Serum: 0.5 ng/mL (ref 0.0–4.0)

## 2024-07-11 ENCOUNTER — Ambulatory Visit: Payer: Self-pay | Admitting: Urology

## 2025-01-06 ENCOUNTER — Other Ambulatory Visit

## 2025-07-02 ENCOUNTER — Other Ambulatory Visit

## 2025-07-09 ENCOUNTER — Ambulatory Visit: Admitting: Urology
